# Patient Record
Sex: Female | Born: 2000 | Race: Black or African American | Hispanic: No | Marital: Single | Smoking: Never smoker
Health system: Southern US, Community
[De-identification: ages and names within clinical notes are randomized; demographics above are authoritative.]

---
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
## 2022-03-05 DIAGNOSIS — Z3042 Encounter for surveillance of injectable contraceptive: Secondary | ICD-10-CM | POA: Diagnosis not present

## 2022-06-07 DIAGNOSIS — Z3042 Encounter for surveillance of injectable contraceptive: Secondary | ICD-10-CM | POA: Diagnosis not present

## 2022-06-07 DIAGNOSIS — Z3202 Encounter for pregnancy test, result negative: Secondary | ICD-10-CM | POA: Diagnosis not present

## 2022-06-07 DIAGNOSIS — Z0000 Encounter for general adult medical examination without abnormal findings: Secondary | ICD-10-CM | POA: Diagnosis not present

## 2022-06-07 DIAGNOSIS — Z113 Encounter for screening for infections with a predominantly sexual mode of transmission: Secondary | ICD-10-CM | POA: Diagnosis not present

## 2022-08-30 DIAGNOSIS — Z3042 Encounter for surveillance of injectable contraceptive: Secondary | ICD-10-CM | POA: Diagnosis not present

## 2023-07-04 ENCOUNTER — Ambulatory Visit (INDEPENDENT_AMBULATORY_CARE_PROVIDER_SITE_OTHER): Payer: Medicaid Other | Admitting: Family

## 2023-07-04 VITALS — BP 121/76 | HR 80 | Temp 98.4°F | Ht 64.0 in | Wt 134.4 lb

## 2023-07-04 DIAGNOSIS — Z3202 Encounter for pregnancy test, result negative: Secondary | ICD-10-CM | POA: Diagnosis not present

## 2023-07-04 DIAGNOSIS — Z7689 Persons encountering health services in other specified circumstances: Secondary | ICD-10-CM

## 2023-07-04 DIAGNOSIS — Z3042 Encounter for surveillance of injectable contraceptive: Secondary | ICD-10-CM | POA: Diagnosis not present

## 2023-07-04 DIAGNOSIS — J3089 Other allergic rhinitis: Secondary | ICD-10-CM | POA: Diagnosis not present

## 2023-07-04 LAB — POCT URINE PREGNANCY: Preg Test, Ur: NEGATIVE

## 2023-07-04 MED ORDER — CETIRIZINE HCL 10 MG PO TABS: 10.0000 mg | ORAL_TABLET | Freq: Every day | ORAL | 2 refills | Status: AC

## 2023-07-04 MED ORDER — FLUTICASONE PROPIONATE 50 MCG/ACT NA SUSP: 1.0000 | Freq: Every day | NASAL | 1 refills | Status: DC

## 2023-07-04 NOTE — Progress Notes (Signed)
 Subjective:    Summer Costa - 23 y.o. female MRN [id number]  Date of birth: January 08, 2001  HPI  Summer Costa is to establish care.   Current issues and/or concerns: - Doing well on Cetirizine  and Fluticasone , no issues/concerns.  - Depo injection due next week and plans to return at that time to be administered.  - No further issues/concerns for discussion today.   ROS per HPI    Health Maintenance:  Health Maintenance Due  Topic Date Due   HPV VACCINES (1 - 3-dose series) Never done   HIV Screening  Never done   Hepatitis C Screening  Never done   DTaP/Tdap/Td (1 - Tdap) Never done   COVID-19 Vaccine (3 - 2024-25 season) 02/23/2023   Cervical Cancer Screening (Pap smear)  Never done     Past Medical History: Patient Active Problem List   Diagnosis Date Noted   Other allergic rhinitis 04/20/2018   Adverse food reaction 04/20/2018      Social History   reports that she has never smoked. She has never used smokeless tobacco. She reports that she does not drink alcohol and does not use drugs.   Family History  family history includes Allergic rhinitis in her brother.   Medications: reviewed and updated   Objective:   Physical Exam BP 121/76   Pulse 80   Temp 98.4 F (36.9 C) (Oral)   Ht 5' 4 (1.626 m)   Wt 134 lb 6.4 oz (61 kg)   LMP  (LMP Unknown)   SpO2 95%   BMI 23.07 kg/m  Physical Exam HENT:     Head: Normocephalic and atraumatic.     Nose: Nose normal.     Mouth/Throat:     Mouth: Mucous membranes are moist.     Pharynx: Oropharynx is clear.  Eyes:     Extraocular Movements: Extraocular movements intact.     Conjunctiva/sclera: Conjunctivae normal.     Pupils: Pupils are equal, round, and reactive to light.  Cardiovascular:     Rate and Rhythm: Normal rate and regular rhythm.     Pulses: Normal pulses.     Heart sounds: Normal heart sounds.  Pulmonary:     Effort: Pulmonary effort is normal.     Breath sounds: Normal breath sounds.   Musculoskeletal:        General: Normal range of motion.     Cervical back: Normal range of motion and neck supple.  Neurological:     General: No focal deficit present.     Mental Status: She is alert and oriented to person, place, and time.  Psychiatric:        Mood and Affect: Mood normal.        Behavior: Behavior normal.       Assessment & Plan:  1. Encounter to establish care (Primary) - Patient presents today to establish care. During the interim follow-up with primary provider as scheduled.  - Return for annual physical examination, labs, and health maintenance. Arrive fasting meaning having no food for at least 8 hours prior to appointment. You may have only water  or black coffee. Please take scheduled medications as normal.  2. Perennial allergic rhinitis - Continue Cetirizine  and Fluticasone  as prescribed. Counseled on medication adherence/adverse effects.  - Follow-up with primary provider as scheduled.  - cetirizine  (ZYRTEC ) 10 MG tablet; Take 1 tablet (10 mg total) by mouth daily.  Dispense: 30 tablet; Refill: 2 - fluticasone  (FLONASE ) 50 MCG/ACT nasal spray; Place 1 spray  into both nostrils daily.  Dispense: 16 g; Refill: 1  3. Encounter for surveillance of injectable contraceptive - Patient states Depo injection due next week and plans to return at that time to have administered.  - Urine pregnancy pending.  - POCT urine pregnancy    Patient was given clear instructions to go to Emergency Department or return to medical center if symptoms don't improve, worsen, or new problems develop.The patient verbalized understanding.  I discussed the assessment and treatment plan with the patient. The patient was provided an opportunity to ask questions and all were answered. The patient agreed with the plan and demonstrated an understanding of the instructions.   The patient was advised to call back or seek an in-person evaluation if the symptoms worsen or if the condition  fails to improve as anticipated.    Greig Drones, NP 07/04/2023, 9:31 AM Primary Care at [facility]

## 2023-07-04 NOTE — Progress Notes (Signed)
 Patient states she needs allergy medication.   Patient wants to get birth control from you and not drive to [place].    Patient wants the depo injection.

## 2023-07-08 ENCOUNTER — Ambulatory Visit (INDEPENDENT_AMBULATORY_CARE_PROVIDER_SITE_OTHER): Payer: Medicaid Other | Admitting: Family

## 2023-07-08 DIAGNOSIS — Z3042 Encounter for surveillance of injectable contraceptive: Secondary | ICD-10-CM | POA: Diagnosis not present

## 2023-07-08 MED ORDER — MEDROXYPROGESTERONE ACETATE 150 MG/ML IM SUSP
150.0000 mg | Freq: Once | INTRAMUSCULAR | Status: AC
  Administered 2023-07-08: 150 mg via INTRAMUSCULAR

## 2023-07-08 NOTE — Progress Notes (Signed)
 Patient left before seeing provider. Depo-Provera administered from [facility].

## 2023-10-03 ENCOUNTER — Encounter: Payer: Medicaid Other | Admitting: Family

## 2023-10-06 ENCOUNTER — Ambulatory Visit: Payer: Medicaid Other

## 2023-10-06 ENCOUNTER — Ambulatory Visit (INDEPENDENT_AMBULATORY_CARE_PROVIDER_SITE_OTHER): Payer: Medicaid Other | Admitting: Family

## 2023-10-06 VITALS — BP 123/73 | HR 73 | Temp 98.1°F | Resp 16 | Ht 64.0 in | Wt 136.2 lb

## 2023-10-06 DIAGNOSIS — R194 Change in bowel habit: Secondary | ICD-10-CM

## 2023-10-06 DIAGNOSIS — Z3042 Encounter for surveillance of injectable contraceptive: Secondary | ICD-10-CM | POA: Diagnosis not present

## 2023-10-06 DIAGNOSIS — Z130 Encounter for screening for diseases of the blood and blood-forming organs and certain disorders involving the immune mechanism: Secondary | ICD-10-CM

## 2023-10-06 DIAGNOSIS — Z13220 Encounter for screening for lipoid disorders: Secondary | ICD-10-CM

## 2023-10-06 DIAGNOSIS — Z13228 Encounter for screening for other metabolic disorders: Secondary | ICD-10-CM

## 2023-10-06 DIAGNOSIS — Z131 Encounter for screening for diabetes mellitus: Secondary | ICD-10-CM

## 2023-10-06 DIAGNOSIS — Z0000 Encounter for general adult medical examination without abnormal findings: Secondary | ICD-10-CM | POA: Diagnosis not present

## 2023-10-06 DIAGNOSIS — Z1329 Encounter for screening for other suspected endocrine disorder: Secondary | ICD-10-CM

## 2023-10-06 DIAGNOSIS — Z5320 Procedure and treatment not carried out because of patient's decision for unspecified reasons: Secondary | ICD-10-CM

## 2023-10-06 DIAGNOSIS — Z113 Encounter for screening for infections with a predominantly sexual mode of transmission: Secondary | ICD-10-CM

## 2023-10-06 MED ORDER — MEDROXYPROGESTERONE ACETATE 150 MG/ML IM SUSP
150.0000 mg | Freq: Once | INTRAMUSCULAR | Status: AC
  Administered 2023-10-06: 150 mg via INTRAMUSCULAR

## 2023-10-06 NOTE — Progress Notes (Signed)
 Patient ID: Summer Costa, female    DOB: 2000/08/03  MRN: [id number]  CC: Annual Exam  Subjective: Summer Costa is a 23 y.o. female who presents for annual exam.  Her concerns today include:  - Frequent bowel movements. Denies red flag symptoms.  Patient Active Problem List   Diagnosis Date Noted   Other allergic rhinitis 04/20/2018   Adverse food reaction 04/20/2018     Current Outpatient Medications on File Prior to Visit  Medication Sig Dispense Refill   cetirizine (ZYRTEC) 10 MG tablet Take 1 tablet (10 mg total) by mouth daily. 30 tablet 2   fluticasone (FLONASE) 50 MCG/ACT nasal spray Place 1 spray into both nostrils daily. 16 g 1   ibuprofen (ADVIL) 600 MG tablet Take 1 tablet (600 mg total) by mouth every 6 (six) hours as needed. 30 tablet 0   medroxyPROGESTERone (DEPO-PROVERA) 150 MG/ML injection Inject into the muscle.     No current facility-administered medications on file prior to visit.    No Known Allergies  Social History   Socioeconomic History   Marital status: Single    Spouse name: Not on file   Number of children: Not on file   Years of education: Not on file   Highest education level: Not on file  Occupational History   Not on file  Tobacco Use   Smoking status: Never   Smokeless tobacco: Never  Substance and Sexual Activity   Alcohol use: No   Drug use: No   Sexual activity: Never  Other Topics Concern   Not on file  Social History Narrative   Not on file   Social Drivers of Health   Financial Resource Strain: Low Risk  (07/04/2023)   Overall Financial Resource Strain (CARDIA)    Difficulty of Paying Living Expenses: Not hard at all  Food Insecurity: No Food Insecurity (10/06/2023)   Hunger Vital Sign    Worried About Running Out of Food in the Last Year: Never true    Ran Out of Food in the Last Year: Never true  Transportation Needs: No Transportation Needs (10/06/2023)   PRAPARE - Administrator, Civil Service  (Medical): No    Lack of Transportation (Non-Medical): No  Physical Activity: Inactive (10/06/2023)   Exercise Vital Sign    Days of Exercise per Week: 0 days    Minutes of Exercise per Session: 0 min  Stress: No Stress Concern Present (07/04/2023)   [employer] of Occupational Health - Occupational Stress Questionnaire    Feeling of Stress : Only a little  Social Connections: Socially Isolated (07/04/2023)   Social Connection and Isolation Panel [NHANES]    Frequency of Communication with Friends and Family: More than three times a week    Frequency of Social Gatherings with Friends and Family: More than three times a week    Attends Religious Services: Never    Database administrator or Organizations: No    Attends Banker Meetings: Never    Marital Status: Never married  Intimate Partner Violence: Not At Risk (07/04/2023)   Humiliation, Afraid, Rape, and Kick questionnaire    Fear of Current or Ex-Partner: No    Emotionally Abused: No    Physically Abused: No    Sexually Abused: No    Family History  Problem Relation Age of Onset   Allergic rhinitis Brother    Atopy Neg Hx    Eczema Neg Hx    Urticaria Neg Hx  No past surgical history on file.  ROS: Review of Systems Negative except as stated above  PHYSICAL EXAM: BP 123/73   Pulse 73   Temp 98.1 F (36.7 C) (Oral)   Resp 16   Ht 5\' 4"  (1.626 m)   Wt 136 lb 3.2 oz (61.8 kg)   SpO2 98%   BMI 23.38 kg/m   Physical Exam HENT:     Head: Normocephalic and atraumatic.     Right Ear: Tympanic membrane, ear canal and external ear normal.     Left Ear: Tympanic membrane, ear canal and external ear normal.     Nose: Nose normal.     Mouth/Throat:     Mouth: Mucous membranes are moist.     Pharynx: Oropharynx is clear.  Eyes:     Extraocular Movements: Extraocular movements intact.     Conjunctiva/sclera: Conjunctivae normal.     Pupils: Pupils are equal, round, and reactive to light.  Neck:      Thyroid: No thyroid mass, thyromegaly or thyroid tenderness.  Cardiovascular:     Rate and Rhythm: Normal rate and regular rhythm.     Pulses: Normal pulses.     Heart sounds: Normal heart sounds.  Pulmonary:     Effort: Pulmonary effort is normal.     Breath sounds: Normal breath sounds.  Chest:     Comments: Patient declined. Abdominal:     General: Bowel sounds are normal.     Palpations: Abdomen is soft.  Genitourinary:    Comments: Patient declined. Musculoskeletal:        General: Normal range of motion.     Right shoulder: Normal.     Left shoulder: Normal.     Right upper arm: Normal.     Left upper arm: Normal.     Right elbow: Normal.     Left elbow: Normal.     Right forearm: Normal.     Left forearm: Normal.     Right wrist: Normal.     Left wrist: Normal.     Right hand: Normal.     Left hand: Normal.     Cervical back: Normal, normal range of motion and neck supple.     Thoracic back: Normal.     Lumbar back: Normal.     Right hip: Normal.     Left hip: Normal.     Right upper leg: Normal.     Left upper leg: Normal.     Right knee: Normal.     Left knee: Normal.     Right lower leg: Normal.     Left lower leg: Normal.     Right ankle: Normal.     Left ankle: Normal.     Right foot: Normal.     Left foot: Normal.  Skin:    General: Skin is warm and dry.     Capillary Refill: Capillary refill takes less than 2 seconds.  Neurological:     General: No focal deficit present.     Mental Status: She is alert and oriented to person, place, and time.  Psychiatric:        Mood and Affect: Mood normal.        Behavior: Behavior normal.    ASSESSMENT AND PLAN: 1. Annual physical exam (Primary) - Counseled on 150 minutes of exercise per week as tolerated, healthy eating (including decreased daily intake of saturated fats, cholesterol, added sugars, sodium), STI prevention, and routine healthcare maintenance.  2. Screening for metabolic disorder -  Patient declined.  3. Screening for deficiency anemia - Patient declined.  4. Diabetes mellitus screening - Patient declined.  5. Screening cholesterol level - Patient declined.   6. Thyroid disorder screen - Patient declined.  7. Cervical cancer screening declined - Patient declined.  8. Routine screening for STI (sexually transmitted infection) - Patient declined.  9. HIV screening declined - Patient declined.  10. Screening for hepatitis C declined - Patient declined.  11. Depo-Provera contraceptive status - Medroxyprogesterone injection administered. Counseled on medication adherence/adverse effects. - Follow-up with primary provider in 12 weeks or sooner if needed. - medroxyPROGESTERone (DEPO-PROVERA) injection 150 mg  12. Frequent bowel movements - Referral to Gastroenterology for evaluation/management. - Ambulatory referral to Gastroenterology   Patient was given the opportunity to ask questions.  Patient verbalized understanding of the plan and was able to repeat key elements of the plan. Patient was given clear instructions to go to Emergency Department or return to medical center if symptoms don't improve, worsen, or new problems develop.The patient verbalized understanding.   Orders Placed This Encounter  Procedures   Ambulatory referral to Gastroenterology    Return in about 1 year (around 10/05/2024) for Physical per patient preference.  Senaida Dama, NP

## 2023-10-06 NOTE — Progress Notes (Signed)
 -  Patient is here to have annually  complete physical examination  -Care gap address -labs taken

## 2023-12-03 ENCOUNTER — Ambulatory Visit
Admission: EM | Admit: 2023-12-03 | Discharge: 2023-12-03 | Disposition: A | Discharge status: Home / Self Care | Attending: Family Medicine | Admitting: Family Medicine

## 2023-12-03 DIAGNOSIS — H9201 Otalgia, right ear: Secondary | ICD-10-CM | POA: Diagnosis not present

## 2023-12-03 DIAGNOSIS — H6121 Impacted cerumen, right ear: Secondary | ICD-10-CM

## 2023-12-03 NOTE — ED Triage Notes (Signed)
 Pt reports she woke up this morning with right ear pain and is muffled.

## 2023-12-06 NOTE — ED Provider Notes (Signed)
 [facility]   [id number] 12/03/23 Arrival Time: 1602  ASSESSMENT & PLAN:  1. Acute otalgia, right   2. Impacted cerumen of right ear    Better after ear flushing. No sign of OM. OTC as needed.   Follow-up Information     Senaida Dama, NP.   Specialty: Nurse Practitioner Why: As needed. Contact information: [address] [place] [address] [phone]                 Reviewed expectations re: course of current medical issues. Questions answered. Outlined signs and symptoms indicating need for more acute intervention. Understanding verbalized. After Visit Summary given.   SUBJECTIVE: History from: Patient. Summer Costa is a 23 y.o. female. Pt reports she woke up this morning with right ear pain and is muffled.  Denies: fever. Normal PO intake without n/v/d.  OBJECTIVE:  Vitals:   12/03/23 1645  BP: 112/68  Pulse: 84  Resp: 14  Temp: 98.4 F (36.9 C)  TempSrc: Oral  SpO2: 96%    General appearance: alert; no distress Eyes: PERRLA; EOMI; conjunctiva normal HENT: [place]; AT; without nasal congestion; R EAC with cerumen impaction Neck: supple Psychological: alert and cooperative; normal mood and affect  Labs:  Labs Reviewed - No data to display  Imaging: No results found.  No Known Allergies  History reviewed. No pertinent past medical history. Social History   Socioeconomic History   Marital status: Single    Spouse name: Not on file   Number of children: Not on file   Years of education: Not on file   Highest education level: Not on file  Occupational History   Not on file  Tobacco Use   Smoking status: Never   Smokeless tobacco: Never  Substance and Sexual Activity   Alcohol use: No   Drug use: No   Sexual activity: Never  Other Topics Concern   Not on file  Social History Narrative   Not on file   Social Drivers of Health   Financial Resource Strain: Low Risk  (07/04/2023)   Overall Financial  Resource Strain (CARDIA)    Difficulty of Paying Living Expenses: Not hard at all  Food Insecurity: No Food Insecurity (10/06/2023)   Hunger Vital Sign    Worried About Running Out of Food in the Last Year: Never true    Ran Out of Food in the Last Year: Never true  Transportation Needs: No Transportation Needs (10/06/2023)   PRAPARE - Administrator, Civil Service (Medical): No    Lack of Transportation (Non-Medical): No  Physical Activity: Inactive (10/06/2023)   Exercise Vital Sign    Days of Exercise per Week: 0 days    Minutes of Exercise per Session: 0 min  Stress: No Stress Concern Present (07/04/2023)   [employer] of Occupational Health - Occupational Stress Questionnaire    Feeling of Stress : Only a little  Social Connections: Socially Isolated (07/04/2023)   Social Connection and Isolation Panel    Frequency of Communication with Friends and Family: More than three times a week    Frequency of Social Gatherings with Friends and Family: More than three times a week    Attends Religious Services: Never    Database administrator or Organizations: No    Attends Banker Meetings: Never    Marital Status: Never married  Intimate Partner Violence: Not At Risk (07/04/2023)   Humiliation, Afraid, Rape, and Kick questionnaire  Fear of Current or Ex-Partner: No    Emotionally Abused: No    Physically Abused: No    Sexually Abused: No   Family History  Problem Relation Age of Onset   Allergic rhinitis Brother    Atopy Neg Hx    Eczema Neg Hx    Urticaria Neg Hx    History reviewed. No pertinent surgical history.   Afton Albright, MD 12/06/23 1006

## 2023-12-22 ENCOUNTER — Ambulatory Visit (INDEPENDENT_AMBULATORY_CARE_PROVIDER_SITE_OTHER): Admitting: *Deleted

## 2023-12-22 DIAGNOSIS — Z3042 Encounter for surveillance of injectable contraceptive: Secondary | ICD-10-CM

## 2023-12-22 MED ORDER — MEDROXYPROGESTERONE ACETATE 150 MG/ML IM SUSP
150.0000 mg | Freq: Once | INTRAMUSCULAR | Status: AC
  Administered 2023-12-22: 150 mg via INTRAMUSCULAR

## 2023-12-22 NOTE — Progress Notes (Addendum)
 Patient is in office today for a nurse visit for Birth Control Injection. Patient Injection was given in the  Left upper quad. gluteus. Patient tolerated injection well.    NEXT DEPO 09/15-09/29

## 2024-03-08 ENCOUNTER — Ambulatory Visit (INDEPENDENT_AMBULATORY_CARE_PROVIDER_SITE_OTHER): Admitting: *Deleted

## 2024-03-08 DIAGNOSIS — Z3042 Encounter for surveillance of injectable contraceptive: Secondary | ICD-10-CM | POA: Diagnosis not present

## 2024-03-08 MED ORDER — MEDROXYPROGESTERONE ACETATE 150 MG/ML IM SUSP
150.0000 mg | Freq: Once | INTRAMUSCULAR | Status: AC
  Administered 2024-03-08: 150 mg via INTRAMUSCULAR

## 2024-03-08 NOTE — Progress Notes (Signed)
Patient is in office today for a nurse visit for Birth Control Injection. Patient Injection was given in the  Right upper quad. gluteus. Patient tolerated injection well.

## 2024-05-24 ENCOUNTER — Ambulatory Visit (INDEPENDENT_AMBULATORY_CARE_PROVIDER_SITE_OTHER): Admitting: *Deleted

## 2024-05-24 ENCOUNTER — Ambulatory Visit

## 2024-05-24 ENCOUNTER — Telehealth: Payer: Self-pay | Admitting: Family

## 2024-05-24 DIAGNOSIS — Z3042 Encounter for surveillance of injectable contraceptive: Secondary | ICD-10-CM | POA: Diagnosis not present

## 2024-05-24 MED ORDER — MEDROXYPROGESTERONE ACETATE 150 MG/ML IM SUSP
150.0000 mg | Freq: Once | INTRAMUSCULAR | Status: AC
  Administered 2024-05-24: 150 mg via INTRAMUSCULAR

## 2024-05-24 NOTE — Progress Notes (Signed)
 Patient is in office today for a nurse visit for Birth Control Injection. Patient Injection was given in the  Left upper quad. gluteus. Patient tolerated injection well.

## 2024-05-24 NOTE — Telephone Encounter (Signed)
 Called patient about missed appt on 12/1. Left voicemail

## 2024-05-28 ENCOUNTER — Other Ambulatory Visit (HOSPITAL_COMMUNITY)
Admission: RE | Admit: 2024-05-28 | Discharge: 2024-05-28 | Disposition: A | Source: Ambulatory Visit | Attending: Family | Admitting: Family

## 2024-05-28 ENCOUNTER — Encounter: Payer: Self-pay | Admitting: Family

## 2024-05-28 ENCOUNTER — Ambulatory Visit: Admitting: Family

## 2024-05-28 VITALS — BP 116/73 | HR 92 | Temp 98.4°F | Resp 16 | Ht 64.0 in | Wt 138.8 lb

## 2024-05-28 DIAGNOSIS — Z13220 Encounter for screening for lipoid disorders: Secondary | ICD-10-CM

## 2024-05-28 DIAGNOSIS — Z131 Encounter for screening for diabetes mellitus: Secondary | ICD-10-CM | POA: Diagnosis not present

## 2024-05-28 DIAGNOSIS — Z130 Encounter for screening for diseases of the blood and blood-forming organs and certain disorders involving the immune mechanism: Secondary | ICD-10-CM | POA: Diagnosis not present

## 2024-05-28 DIAGNOSIS — Z114 Encounter for screening for human immunodeficiency virus [HIV]: Secondary | ICD-10-CM

## 2024-05-28 DIAGNOSIS — Z13228 Encounter for screening for other metabolic disorders: Secondary | ICD-10-CM

## 2024-05-28 DIAGNOSIS — Z113 Encounter for screening for infections with a predominantly sexual mode of transmission: Secondary | ICD-10-CM

## 2024-05-28 DIAGNOSIS — Z1329 Encounter for screening for other suspected endocrine disorder: Secondary | ICD-10-CM

## 2024-05-28 DIAGNOSIS — Z1159 Encounter for screening for other viral diseases: Secondary | ICD-10-CM

## 2024-05-28 NOTE — Progress Notes (Signed)
 Patient have question about birth control, patient wants lab work

## 2024-05-28 NOTE — Progress Notes (Signed)
 Patient ID: Summer Costa, female    DOB: 03-24-2001  MRN: [id number]  CC: Follow-Up  Subjective: Summer Costa is a 23 y.o. female who presents for follow-up.   Her concerns today include:  - Patient seen for annual physical exam on 10/06/2023 and declined labs. Today patient states she would like to have physical labs collected.  - It is noted cervical cancer screening postponed until 10/05/2024. - States she is considering not taking next dose of Depo Provera  but wants to discuss with her partner first. I discussed with patient in detail risks associated with not taking contraception management if having intercourse. Patient verbalized understanding.   Patient Active Problem List   Diagnosis Date Noted   Other allergic rhinitis 04/20/2018   Adverse food reaction 04/20/2018     Current Outpatient Medications on File Prior to Visit  Medication Sig Dispense Refill   cetirizine  (ZYRTEC ) 10 MG tablet Take 1 tablet (10 mg total) by mouth daily. 30 tablet 2   medroxyPROGESTERone  (DEPO-PROVERA ) 150 MG/ML injection Inject into the muscle.     No current facility-administered medications on file prior to visit.    No Known Allergies  Social History   Socioeconomic History   Marital status: Single    Spouse name: Not on file   Number of children: Not on file   Years of education: Not on file   Highest education level: Not on file  Occupational History   Not on file  Tobacco Use   Smoking status: Never   Smokeless tobacco: Never  Vaping Use   Vaping status: Never Used  Substance and Sexual Activity   Alcohol use: No   Drug use: No   Sexual activity: Yes    Birth control/protection: Pill  Other Topics Concern   Not on file  Social History Narrative   Not on file   Social Drivers of Health   Financial Resource Strain: Low Risk  (07/04/2023)   Overall Financial Resource Strain (CARDIA)    Difficulty of Paying Living Expenses: Not hard at all  Food Insecurity: No Food  Insecurity (10/06/2023)   Hunger Vital Sign    Worried About Running Out of Food in the Last Year: Never true    Ran Out of Food in the Last Year: Never true  Transportation Needs: No Transportation Needs (10/06/2023)   PRAPARE - Administrator, Civil Service (Medical): No    Lack of Transportation (Non-Medical): No  Physical Activity: Inactive (10/06/2023)   Exercise Vital Sign    Days of Exercise per Week: 0 days    Minutes of Exercise per Session: 0 min  Stress: No Stress Concern Present (07/04/2023)   [employer] of Occupational Health - Occupational Stress Questionnaire    Feeling of Stress : Only a little  Social Connections: Socially Isolated (07/04/2023)   Social Connection and Isolation Panel    Frequency of Communication with Friends and Family: More than three times a week    Frequency of Social Gatherings with Friends and Family: More than three times a week    Attends Religious Services: Never    Database Administrator or Organizations: No    Attends Banker Meetings: Never    Marital Status: Never married  Intimate Partner Violence: Not At Risk (07/04/2023)   Humiliation, Afraid, Rape, and Kick questionnaire    Fear of Current or Ex-Partner: No    Emotionally Abused: No    Physically Abused: No    Sexually  Abused: No    Family History  Problem Relation Age of Onset   Allergic rhinitis Brother    Atopy Neg Hx    Eczema Neg Hx    Urticaria Neg Hx     History reviewed. No pertinent surgical history.  ROS: Review of Systems Negative except as stated above  PHYSICAL EXAM: BP 116/73   Pulse 92   Temp 98.4 F (36.9 C) (Oral)   Resp 16   Ht 5' 4 (1.626 m)   Wt 138 lb 12.8 oz (63 kg)   SpO2 97%   BMI 23.82 kg/m   Physical Exam HENT:     Head: Normocephalic and atraumatic.     Nose: Nose normal.     Mouth/Throat:     Mouth: Mucous membranes are moist.     Pharynx: Oropharynx is clear.  Eyes:     Extraocular Movements:  Extraocular movements intact.     Conjunctiva/sclera: Conjunctivae normal.     Pupils: Pupils are equal, round, and reactive to light.  Cardiovascular:     Rate and Rhythm: Normal rate and regular rhythm.     Pulses: Normal pulses.     Heart sounds: Normal heart sounds.  Pulmonary:     Effort: Pulmonary effort is normal.     Breath sounds: Normal breath sounds.  Musculoskeletal:        General: Normal range of motion.     Cervical back: Normal range of motion and neck supple.  Neurological:     General: No focal deficit present.     Mental Status: She is alert and oriented to person, place, and time.  Psychiatric:        Mood and Affect: Mood normal.        Behavior: Behavior normal.     ASSESSMENT AND PLAN: 1. Screening for metabolic disorder (Primary) - Routine screening.  - CMP14+EGFR  2. Screening for deficiency anemia - Routine screening.  - CBC  3. Diabetes mellitus screening - Routine screening.  - Hemoglobin A1c  4. Screening cholesterol level - Routine screening.  - Lipid panel  5. Thyroid disorder screen - Routine screening.  - TSH  6. Routine screening for STI (sexually transmitted infection) - Routine screening.  - Cervicovaginal ancillary only  7. Encounter for screening for HIV - Routine screening.  - HIV antibody (with reflex)  8. Need for hepatitis C screening test - Routine screening.  - Hepatitis C Antibody    Patient was given the opportunity to ask questions.  Patient verbalized understanding of the plan and was able to repeat key elements of the plan. Patient was given clear instructions to go to Emergency Department or return to medical center if symptoms don't improve, worsen, or new problems develop.The patient verbalized understanding.   Orders Placed This Encounter  Procedures   CBC   Lipid panel   CMP14+EGFR   Hemoglobin A1c   TSH   HIV antibody (with reflex)   Hepatitis C Antibody    Return for Follow-up as  needed.  Greig JINNY Chute, NP

## 2024-05-29 LAB — CMP14+EGFR
ALT: 33 IU/L — ABNORMAL HIGH (ref 0–32)
AST: 28 IU/L (ref 0–40)
Albumin: 4.6 g/dL (ref 4.0–5.0)
Alkaline Phosphatase: 69 IU/L (ref 41–116)
BUN/Creatinine Ratio: 11 (ref 9–23)
BUN: 11 mg/dL (ref 6–20)
Bilirubin Total: 0.7 mg/dL (ref 0.0–1.2)
CO2: 21 mmol/L (ref 20–29)
Calcium: 9.2 mg/dL (ref 8.7–10.2)
Chloride: 107 mmol/L — ABNORMAL HIGH (ref 96–106)
Creatinine, Ser: 0.98 mg/dL (ref 0.57–1.00)
Globulin, Total: 2.5 g/dL (ref 1.5–4.5)
Glucose: 70 mg/dL (ref 70–99)
Potassium: 4.2 mmol/L (ref 3.5–5.2)
Sodium: 143 mmol/L (ref 134–144)
Total Protein: 7.1 g/dL (ref 6.0–8.5)
eGFR: 84 mL/min/1.73 (ref >59)

## 2024-05-29 LAB — CBC
Hematocrit: 41.8 % (ref 34.0–46.6)
Hemoglobin: 13.6 g/dL (ref 11.1–15.9)
MCH: 31 pg (ref 26.6–33.0)
MCHC: 32.5 g/dL (ref 31.5–35.7)
MCV: 95 fL (ref 79–97)
Platelets: 238 x10E3/uL (ref 150–450)
RBC: 4.39 x10E6/uL (ref 3.77–5.28)
RDW: 11.7 % (ref 11.7–15.4)
WBC: 4.3 x10E3/uL (ref 3.4–10.8)

## 2024-05-29 LAB — HEPATITIS C ANTIBODY: Hep C Virus Ab: NONREACTIVE

## 2024-05-29 LAB — HEMOGLOBIN A1C
Est. average glucose Bld gHb Est-mCnc: 88 mg/dL
Hgb A1c MFr Bld: 4.7 % — ABNORMAL LOW (ref 4.8–5.6)

## 2024-05-29 LAB — LIPID PANEL
Chol/HDL Ratio: 3.5 ratio (ref 0.0–4.4)
Cholesterol, Total: 183 mg/dL (ref 100–199)
HDL: 53 mg/dL (ref >39)
LDL Chol Calc (NIH): 117 mg/dL — ABNORMAL HIGH (ref 0–99)
Triglycerides: 69 mg/dL (ref 0–149)
VLDL Cholesterol Cal: 13 mg/dL (ref 5–40)

## 2024-05-29 LAB — HIV ANTIBODY (ROUTINE TESTING W REFLEX): HIV Screen 4th Generation wRfx: NONREACTIVE

## 2024-05-29 LAB — TSH: TSH: 1.25 u[IU]/mL (ref 0.450–4.500)

## 2024-05-31 ENCOUNTER — Ambulatory Visit: Payer: Self-pay | Admitting: Family

## 2024-05-31 DIAGNOSIS — Z13220 Encounter for screening for lipoid disorders: Secondary | ICD-10-CM

## 2024-05-31 DIAGNOSIS — B9689 Other specified bacterial agents as the cause of diseases classified elsewhere: Secondary | ICD-10-CM

## 2024-05-31 DIAGNOSIS — Z13228 Encounter for screening for other metabolic disorders: Secondary | ICD-10-CM

## 2024-05-31 LAB — CERVICOVAGINAL ANCILLARY ONLY
Bacterial Vaginitis (gardnerella): POSITIVE — AB
Candida Glabrata: NEGATIVE
Candida Vaginitis: NEGATIVE
Chlamydia: NEGATIVE
Comment: NEGATIVE
Comment: NEGATIVE
Comment: NEGATIVE
Comment: NEGATIVE
Comment: NEGATIVE
Comment: NORMAL
Neisseria Gonorrhea: NEGATIVE
Trichomonas: NEGATIVE

## 2024-05-31 MED ORDER — METRONIDAZOLE 500 MG PO TABS: 500.0000 mg | ORAL_TABLET | Freq: Two times a day (BID) | ORAL | 0 refills | Status: AC

## 2024-06-21 ENCOUNTER — Ambulatory Visit: Admitting: Family

## 2024-08-18 ENCOUNTER — Ambulatory Visit

## 2024-10-05 ENCOUNTER — Encounter: Admitting: Family
